# Patient Record
Sex: Male | Born: 1935 | Race: White | Hispanic: No | Marital: Married | Smoking: Former smoker
Health system: Southern US, Community
[De-identification: ages and names within clinical notes are randomized; demographics above are authoritative.]

## PROBLEM LIST (undated history)

## (undated) DIAGNOSIS — K219 Gastro-esophageal reflux disease without esophagitis: Secondary | ICD-10-CM

## (undated) DIAGNOSIS — M8580 Other specified disorders of bone density and structure, unspecified site: Secondary | ICD-10-CM

## (undated) DIAGNOSIS — R7301 Impaired fasting glucose: Secondary | ICD-10-CM

## (undated) DIAGNOSIS — I6529 Occlusion and stenosis of unspecified carotid artery: Secondary | ICD-10-CM

## (undated) DIAGNOSIS — G4733 Obstructive sleep apnea (adult) (pediatric): Secondary | ICD-10-CM

## (undated) DIAGNOSIS — E785 Hyperlipidemia, unspecified: Secondary | ICD-10-CM

## (undated) DIAGNOSIS — G4730 Sleep apnea, unspecified: Secondary | ICD-10-CM

## (undated) DIAGNOSIS — E119 Type 2 diabetes mellitus without complications: Secondary | ICD-10-CM

## (undated) HISTORY — DX: Gastro-esophageal reflux disease without esophagitis: K21.9

## (undated) HISTORY — DX: Type 2 diabetes mellitus without complications: E11.9

## (undated) HISTORY — DX: Obstructive sleep apnea (adult) (pediatric): G47.33

## (undated) HISTORY — DX: Sleep apnea, unspecified: G47.30

## (undated) HISTORY — DX: Occlusion and stenosis of unspecified carotid artery: I65.29

## (undated) HISTORY — DX: Impaired fasting glucose: R73.01

## (undated) HISTORY — DX: Hyperlipidemia, unspecified: E78.5

## (undated) HISTORY — DX: Other specified disorders of bone density and structure, unspecified site: M85.80

## (undated) HISTORY — PX: HERNIA REPAIR: SHX51

---

## 1966-05-30 HISTORY — PX: TYMPANOPLASTY W/ MASTOIDECTOMY: SUR1400

## 1981-05-30 HISTORY — PX: EYE SURGERY: SHX253

## 1999-05-04 ENCOUNTER — Ambulatory Visit (HOSPITAL_COMMUNITY): Admission: RE | Admit: 1999-05-04 | Discharge: 1999-05-04 | Payer: Self-pay | Admitting: Gastroenterology

## 1999-05-04 ENCOUNTER — Encounter (INDEPENDENT_AMBULATORY_CARE_PROVIDER_SITE_OTHER): Payer: Self-pay

## 2003-02-17 ENCOUNTER — Ambulatory Visit (HOSPITAL_COMMUNITY): Admission: RE | Admit: 2003-02-17 | Discharge: 2003-02-17 | Payer: Self-pay | Admitting: Gastroenterology

## 2008-09-10 ENCOUNTER — Ambulatory Visit (HOSPITAL_COMMUNITY): Admission: RE | Admit: 2008-09-10 | Discharge: 2008-09-10 | Payer: Self-pay | Admitting: Surgery

## 2010-02-23 ENCOUNTER — Emergency Department (HOSPITAL_COMMUNITY): Admission: EM | Admit: 2010-02-23 | Discharge: 2010-02-24 | Payer: Self-pay | Admitting: Emergency Medicine

## 2010-09-08 LAB — BASIC METABOLIC PANEL
BUN: 11 mg/dL (ref 6–23)
CO2: 28 mEq/L (ref 19–32)
Calcium: 9.3 mg/dL (ref 8.4–10.5)
Glucose, Bld: 141 mg/dL — ABNORMAL HIGH (ref 70–99)
Potassium: 4.3 mEq/L (ref 3.5–5.1)
Sodium: 141 mEq/L (ref 135–145)

## 2010-09-08 LAB — CBC
HCT: 43 % (ref 39.0–52.0)
Hemoglobin: 14.8 g/dL (ref 13.0–17.0)
MCHC: 34.4 g/dL (ref 30.0–36.0)
Platelets: 144 10*3/uL — ABNORMAL LOW (ref 150–400)
RDW: 13.4 % (ref 11.5–15.5)

## 2010-09-08 LAB — DIFFERENTIAL
Basophils Absolute: 0 10*3/uL (ref 0.0–0.1)
Basophils Relative: 0 % (ref 0–1)
Eosinophils Relative: 4 % (ref 0–5)
Monocytes Absolute: 0.4 10*3/uL (ref 0.1–1.0)

## 2010-10-12 NOTE — Op Note (Signed)
NAME:  Jesse Dillon, Jesse Dillon               ACCOUNT NO.:  [id number]   MEDICAL RECORD NO.:  [id number]          PATIENT TYPE:  AMB   LOCATION:  DAY                          FACILITY:  [facility]   PHYSICIAN:  Wilmon Arms. Corliss Skains, M.D. DATE OF BIRTH:  02-19-1933   DATE OF PROCEDURE:  09/10/2008  DATE OF DISCHARGE:                               OPERATIVE REPORT   PREOPERATIVE DIAGNOSES:  1. Recurrent left inguinal hernia.  2. Direct right inguinal hernia.   PROCEDURE PERFORMED:  Open bilateral inguinal hernia repair with mesh.   SURGEON:  Wilmon Arms. Tsuei, M.D.   ANESTHESIA:  General.   INDICATIONS:  The patient is a 77 year old male who has had a left  inguinal hernia repaired about 35 years ago.  He has developed bulges in  both groins.  He has also had a lot of problems with hemorrhoids.  When  I examined him I recommended repair of the hernias but did not recommend  concurrent hemorrhoidectomy due to the risk of infection.  He presents  now for repairs of his hernias.   DESCRIPTION OF PROCEDURE:  The patient brought to the operating placed  ilioinguinal nerve supine position on operating table.  After an  adequate level of general anesthesia was obtained, the patient's lower  abdomen and pelvis were shaved, prepped with Betadine and draped in  sterile fashion.  Time-out was taken to assure proper patient, proper  procedure.  We infiltrated an area around his previous left groin  incision with the 0.25% Marcaine with epinephrine.  I opened this  incision and dissected down through the subcutaneous tissues and scar  tissue down to the external oblique fascia.  The external oblique fascia  was opened along direction of its fibers down the external ring.  The  patient has a large direct defect with a very diminutive spermatic cord.  We dissected around the spermatic cord and retracted it with a Penrose  drain.  The direct hernia sac was circumferentially dissected was  reduced up into the  preperitoneal space.  The floor of the inguinal  canal was then imbricated with a 0 Vicryl.  Prolene mesh was cut to the  keyhole shape and secured with 2-0 Prolene beginning at the pubic  tubercle running along Cooper's ligament inferiorly and the internal  oblique fascia superiorly.  The tails were sutured together behind the  cord and the tails were tucked underneath the external oblique fascia  laterally.  The fascia was reapproximated 2-0 Vicryl.  3-0 Vicryl was  used to close subcutaneous tissues and 4-0 Monocryl was used to close  the skin.  We turned our attention to the right groin.  We infiltrated  the area above the right inguinal ligament with 0.25% Marcaine with  epinephrine.  An incision was made and dissection was carried down to  the external oblique fascia.  The fascia was opened along direction of  its fibers down to the external ring.  The patient had another direct  hernia on this side.  These spermatic cord was dissected free and was  retracted with a Penrose drain.  There is  no sign of indirect hernia.  After clearing the floor of the inguinal canal, we imbricated with 0  Vicryl in running fashion.  We  cut UltraPro mesh into a keyhole shape and secured with 2-0 Prolene.  The wound was closed in similar fashion.  Steri-Strips and clean  dressings were applied.  The patient was then extubated, brought to the  recovery in stable condition.  All sponge, instrument and needle counts  were correct.      Wilmon Arms. Tsuei, M.D.  Electronically Signed     MKT/MEDQ  D:  09/10/2008  T:  09/10/2008  Job:  [id number]   cc:   Chales Salmon. Abigail Miyamoto, M.D.  Fax: [phone]

## 2010-10-15 NOTE — Op Note (Signed)
   NAME:  Jesse Dillon, Jesse Dillon                         ACCOUNT NO.:  [id number]   MEDICAL RECORD NO.:  [id number]                   PATIENT TYPE:  AMB   LOCATION:  ENDO                                 FACILITY:  [facility]   PHYSICIAN:  James L. Malon Kindle., M.D.          DATE OF BIRTH:  06-Aug-1932   DATE OF PROCEDURE:  02/17/2003  DATE OF DISCHARGE:                                 OPERATIVE REPORT   PROCEDURE:  Colonoscopy and polypectomy.   MEDICATIONS:  Fentanyl 50 mcg, Versed 5 mg IV.   SCOPE:  Adult pediatric adjustable colonoscope.   INDICATIONS:  Patient with a previous history of adenomatous colon polyps.  This is done as a followup   DESCRIPTION OF PROCEDURE:  The procedure had been explained to the patient  and consent obtained.  The patient was placed in the left lateral decubitus  position.  Olympus scope was inserted and advanced.  Prep was excellent.  Was able to reach easily to the cecum. The ileocecal valve and appendiceal  orifice were seen.  The scope was withdrawn into the cecum.  Ascending  colon, hepatic flexure, transverse colon, and descending colon were seen  well.  A 0.5 cm sessile polyp was snared in the descending colon but we were  unable to locate it.  It could, thus, not be recovered.  We did pour all the  suction cannister through without any polyp being recovered.  The patient  had moderate diverticulosis in the sigmoid colon.  No polyps were seen in  the sigmoid.  The scope was withdrawn.  The patient tolerated the procedure  well.   ASSESSMENT:  1. Descending colon polyp removed from snare but unfortunately could not be     found.  2. Diverticulosis.   PLAN:  Routine post polypectomy instructions.  Will recommend repeating  procedure in three years.                                                James L. Malon Kindle., M.D.    Waldron Session  D:  02/17/2003  T:  02/17/2003  Job:  [id number]   cc:   Chales Salmon. Abigail Miyamoto, M.D.  [address]  [place]  [place] [address]  Fax: [phone]

## 2011-09-22 ENCOUNTER — Other Ambulatory Visit: Payer: Self-pay | Admitting: Family Medicine

## 2011-09-22 DIAGNOSIS — E782 Mixed hyperlipidemia: Secondary | ICD-10-CM | POA: Diagnosis not present

## 2011-09-22 DIAGNOSIS — Z0000 Encounter for general adult medical examination without abnormal findings: Secondary | ICD-10-CM | POA: Diagnosis not present

## 2011-09-22 DIAGNOSIS — Z79899 Other long term (current) drug therapy: Secondary | ICD-10-CM | POA: Diagnosis not present

## 2011-09-22 DIAGNOSIS — R7301 Impaired fasting glucose: Secondary | ICD-10-CM | POA: Diagnosis not present

## 2011-09-22 DIAGNOSIS — R9389 Abnormal findings on diagnostic imaging of other specified body structures: Secondary | ICD-10-CM

## 2011-09-22 DIAGNOSIS — M899 Disorder of bone, unspecified: Secondary | ICD-10-CM | POA: Diagnosis not present

## 2011-09-22 DIAGNOSIS — Z125 Encounter for screening for malignant neoplasm of prostate: Secondary | ICD-10-CM | POA: Diagnosis not present

## 2011-09-22 DIAGNOSIS — M79609 Pain in unspecified limb: Secondary | ICD-10-CM | POA: Diagnosis not present

## 2011-09-23 ENCOUNTER — Encounter: Payer: Self-pay | Admitting: Vascular Surgery

## 2011-09-26 ENCOUNTER — Encounter: Payer: Self-pay | Admitting: Vascular Surgery

## 2011-09-27 ENCOUNTER — Ambulatory Visit
Admission: RE | Admit: 2011-09-27 | Discharge: 2011-09-27 | Disposition: A | Discharge status: Home / Self Care | Payer: Medicare Other | Source: Ambulatory Visit | Attending: Family Medicine | Admitting: Family Medicine

## 2011-09-27 ENCOUNTER — Encounter: Payer: Self-pay | Admitting: Vascular Surgery

## 2011-09-27 ENCOUNTER — Ambulatory Visit (INDEPENDENT_AMBULATORY_CARE_PROVIDER_SITE_OTHER): Payer: Medicare Other | Admitting: *Deleted

## 2011-09-27 ENCOUNTER — Ambulatory Visit (INDEPENDENT_AMBULATORY_CARE_PROVIDER_SITE_OTHER): Payer: Medicare Other | Admitting: Vascular Surgery

## 2011-09-27 VITALS — BP 125/64 | HR 55 | Temp 97.5°F | Ht 68.0 in | Wt 177.0 lb

## 2011-09-27 DIAGNOSIS — R9389 Abnormal findings on diagnostic imaging of other specified body structures: Secondary | ICD-10-CM

## 2011-09-27 DIAGNOSIS — I6529 Occlusion and stenosis of unspecified carotid artery: Secondary | ICD-10-CM | POA: Diagnosis not present

## 2011-09-27 DIAGNOSIS — R9430 Abnormal result of cardiovascular function study, unspecified: Secondary | ICD-10-CM

## 2011-09-27 DIAGNOSIS — I70219 Atherosclerosis of native arteries of extremities with intermittent claudication, unspecified extremity: Secondary | ICD-10-CM | POA: Insufficient documentation

## 2011-09-27 DIAGNOSIS — M79609 Pain in unspecified limb: Secondary | ICD-10-CM | POA: Diagnosis not present

## 2011-09-27 NOTE — Progress Notes (Signed)
Subjective:     Patient ID: Jesse Dillon, male   DOB: November 14, 1932, 78 y.o.   MRN: [id number]  HPI this 78 year old male patient was referred by Dr. Catha Gosselin for evaluation of lower extremity circulation. Patient states he has occasional hot sensations on lateral aspect of right lower leg. This lasted 10 seconds or so. Not. Accompanied by edema or other symptoms.. He denies claudication symptoms being able to ambulate several blocks. He has no history of gangrene, cellulitis, nonhealing ulcers, or infection. He also has no history of DVT, thrombophlebitis, pulmonary embolus, or clotting problems. He had a borderline abnormal life screen lower extremity study. He also had some mild left carotid disease on [employer] screening. He has no history of stroke or lateralizing weakness, amaurosis fugax, diplopia, blurred vision, or syncope.  Past Medical History  Diagnosis Date  . Hyperlipidemia   . Obstructive sleep apnea   . Osteopenia   . Impaired fasting glucose   . Carotid artery occlusion   . GERD (gastroesophageal reflux disease)     History  Substance Use Topics  . Smoking status: Former Smoker    Quit date: 05/30/1972  . Smokeless tobacco: Not on file  . Alcohol Use: 0.6 oz/week    1 Glasses of wine per week     occasional    Family History  Problem Relation Age of Onset  . Stroke Mother     x 2  . Diabetes Mother   . Heart disease Mother   . Hypertension Mother   . Heart disease Father   . Heart attack Father   . Migraines Sister     Allergies  Allergen Reactions  . Lipitor (Atorvastatin Calcium)   . Zetia (Ezetimibe)   . Ceftin Rash  . Niaspan (Niacin Er (Antihyperlipidemic)) Rash  . Sulfa Antibiotics Rash    Current outpatient prescriptions:Ascorbic Acid (VITAMIN C) 1000 MG tablet, Take 1,000 mg by mouth daily., Disp: , Rfl: ;  aspirin 81 MG tablet, Take 81 mg by mouth daily., Disp: , Rfl: ;  ASTEPRO 0.15 % SOLN, 2 puffs in each nostril daily, Disp: , Rfl: ;   azelastine (ASTELIN) 137 MCG/SPRAY nasal spray, Place 1 spray into the nose 2 (two) times daily. Use in each nostril as directed, Disp: , Rfl:  cholecalciferol (VITAMIN D) 1000 UNITS tablet, Take 1,000 Units by mouth daily., Disp: , Rfl: ;  co-enzyme Q-10 30 MG capsule, Take 200 mg by mouth daily., Disp: , Rfl: ;  doxycycline (DORYX) 100 MG EC tablet, Take 100 mg by mouth 2 (two) times daily., Disp: , Rfl: ;  fish oil-omega-3 fatty acids 1000 MG capsule, Take 1 g by mouth daily., Disp: , Rfl: ;  fluocinonide cream (LIDEX) 0.05 %, Apply 1 application topically as needed., Disp: , Rfl:  folic acid (FOLVITE) 800 MCG tablet, Take 800 mcg by mouth daily., Disp: , Rfl: ;  Glucosamine-Chondroit-Vit C-Mn (GLUCOSAMINE 1500 COMPLEX PO), Take by mouth., Disp: , Rfl: ;  polycarbophil (FIBERCON) 625 MG tablet, Take 625 mg by mouth daily., Disp: , Rfl: ;  rosuvastatin (CRESTOR) 5 MG tablet, Take 5 mg by mouth daily., Disp: , Rfl: ;  alendronate (FOSAMAX) 70 MG tablet, Take 70 mg by mouth daily. Will begin on Sat 10/01/2011, Disp: , Rfl:   BP 125/64  Pulse 55  Temp(Src) 97.5 F (36.4 C) (Oral)  Ht 5\' 8"  (1.727 m)  Wt 177 lb (80.287 kg)  BMI 26.91 kg/m2  SpO2 100%  Body mass index is 26.91 kg/(m^2).  Review of Systems denies chest pain, dyspnea on exertion, PND, orthopnea, hemoptysis. All other systems negative and complete review of systems     Objective:   Physical Exam pressure 125/64 heart rate 55 respirations 16 Gen.-alert and oriented x3 in no apparent distress HEENT normal for age Lungs no rhonchi or wheezing Cardiovascular regular rhythm no murmurs carotid pulses 3+ palpable no bruits audible Abdomen soft nontender no palpable masses Musculoskeletal free of  major deformities Skin clear -no rashes Neurologic normal Lower extremities 3+ femoral and dorsalis pedis pulses palpable bilaterally with no edema. There is no evidence of venous insufficiency in either lower extremity.  No varicosities are noted. The area where the burning discomfort occurs on the lateral aspect of the right lower leg has no abnormal findings on physical exam  Today I ordered lower extremity arterial Doppler study which I reviewed and interpreted. ABIs are totally normal.      Assessment:     No evidence of arterial or venous insufficiency to explain occasional warm sensation right lower leg    Plan:     No further workup indicated for arterial or venous disease and lower extremities Mild carotid occlusive disease will be followed by Dr. Clarene Duke with periodic duplex scan of carotid arteries.-Patient is asymptomatic

## 2011-11-09 DIAGNOSIS — R972 Elevated prostate specific antigen [PSA]: Secondary | ICD-10-CM | POA: Diagnosis not present

## 2011-11-10 DIAGNOSIS — H2510 Age-related nuclear cataract, unspecified eye: Secondary | ICD-10-CM | POA: Diagnosis not present

## 2011-12-07 DIAGNOSIS — R21 Rash and other nonspecific skin eruption: Secondary | ICD-10-CM | POA: Diagnosis not present

## 2011-12-07 DIAGNOSIS — T148XXA Other injury of unspecified body region, initial encounter: Secondary | ICD-10-CM | POA: Diagnosis not present

## 2012-02-08 DIAGNOSIS — Z23 Encounter for immunization: Secondary | ICD-10-CM | POA: Diagnosis not present

## 2012-02-21 DIAGNOSIS — G4733 Obstructive sleep apnea (adult) (pediatric): Secondary | ICD-10-CM | POA: Diagnosis not present

## 2012-03-14 DIAGNOSIS — Z23 Encounter for immunization: Secondary | ICD-10-CM | POA: Diagnosis not present

## 2012-04-06 DIAGNOSIS — R7301 Impaired fasting glucose: Secondary | ICD-10-CM | POA: Diagnosis not present

## 2012-04-06 DIAGNOSIS — R972 Elevated prostate specific antigen [PSA]: Secondary | ICD-10-CM | POA: Diagnosis not present

## 2012-04-30 DIAGNOSIS — J069 Acute upper respiratory infection, unspecified: Secondary | ICD-10-CM | POA: Diagnosis not present

## 2012-05-07 DIAGNOSIS — J40 Bronchitis, not specified as acute or chronic: Secondary | ICD-10-CM | POA: Diagnosis not present

## 2012-05-07 DIAGNOSIS — J329 Chronic sinusitis, unspecified: Secondary | ICD-10-CM | POA: Diagnosis not present

## 2012-06-21 DIAGNOSIS — M25519 Pain in unspecified shoulder: Secondary | ICD-10-CM | POA: Diagnosis not present

## 2012-06-29 DIAGNOSIS — L819 Disorder of pigmentation, unspecified: Secondary | ICD-10-CM | POA: Diagnosis not present

## 2012-06-29 DIAGNOSIS — D1801 Hemangioma of skin and subcutaneous tissue: Secondary | ICD-10-CM | POA: Diagnosis not present

## 2012-06-29 DIAGNOSIS — D2360 Other benign neoplasm of skin of unspecified upper limb, including shoulder: Secondary | ICD-10-CM | POA: Diagnosis not present

## 2012-06-29 DIAGNOSIS — D485 Neoplasm of uncertain behavior of skin: Secondary | ICD-10-CM | POA: Diagnosis not present

## 2012-06-29 DIAGNOSIS — L820 Inflamed seborrheic keratosis: Secondary | ICD-10-CM | POA: Diagnosis not present

## 2012-06-29 DIAGNOSIS — L821 Other seborrheic keratosis: Secondary | ICD-10-CM | POA: Diagnosis not present

## 2012-07-10 DIAGNOSIS — R972 Elevated prostate specific antigen [PSA]: Secondary | ICD-10-CM | POA: Diagnosis not present

## 2012-07-18 DIAGNOSIS — R972 Elevated prostate specific antigen [PSA]: Secondary | ICD-10-CM | POA: Diagnosis not present

## 2012-10-02 DIAGNOSIS — Z79899 Other long term (current) drug therapy: Secondary | ICD-10-CM | POA: Diagnosis not present

## 2012-10-02 DIAGNOSIS — K219 Gastro-esophageal reflux disease without esophagitis: Secondary | ICD-10-CM | POA: Diagnosis not present

## 2012-10-02 DIAGNOSIS — M949 Disorder of cartilage, unspecified: Secondary | ICD-10-CM | POA: Diagnosis not present

## 2012-10-02 DIAGNOSIS — R7301 Impaired fasting glucose: Secondary | ICD-10-CM | POA: Diagnosis not present

## 2012-10-02 DIAGNOSIS — Z0000 Encounter for general adult medical examination without abnormal findings: Secondary | ICD-10-CM | POA: Diagnosis not present

## 2012-10-02 DIAGNOSIS — E7800 Pure hypercholesterolemia, unspecified: Secondary | ICD-10-CM | POA: Diagnosis not present

## 2012-10-02 DIAGNOSIS — J309 Allergic rhinitis, unspecified: Secondary | ICD-10-CM | POA: Diagnosis not present

## 2012-10-02 DIAGNOSIS — M899 Disorder of bone, unspecified: Secondary | ICD-10-CM | POA: Diagnosis not present

## 2012-10-02 DIAGNOSIS — R972 Elevated prostate specific antigen [PSA]: Secondary | ICD-10-CM | POA: Diagnosis not present

## 2012-12-10 DIAGNOSIS — H2510 Age-related nuclear cataract, unspecified eye: Secondary | ICD-10-CM | POA: Diagnosis not present

## 2013-01-07 DIAGNOSIS — R972 Elevated prostate specific antigen [PSA]: Secondary | ICD-10-CM | POA: Diagnosis not present

## 2013-01-11 DIAGNOSIS — R972 Elevated prostate specific antigen [PSA]: Secondary | ICD-10-CM | POA: Diagnosis not present

## 2013-02-05 DIAGNOSIS — Z23 Encounter for immunization: Secondary | ICD-10-CM | POA: Diagnosis not present

## 2013-02-21 DIAGNOSIS — G4733 Obstructive sleep apnea (adult) (pediatric): Secondary | ICD-10-CM | POA: Diagnosis not present

## 2013-04-02 DIAGNOSIS — R7309 Other abnormal glucose: Secondary | ICD-10-CM | POA: Diagnosis not present

## 2013-07-01 DIAGNOSIS — L820 Inflamed seborrheic keratosis: Secondary | ICD-10-CM | POA: Diagnosis not present

## 2013-07-01 DIAGNOSIS — D1801 Hemangioma of skin and subcutaneous tissue: Secondary | ICD-10-CM | POA: Diagnosis not present

## 2013-07-01 DIAGNOSIS — L819 Disorder of pigmentation, unspecified: Secondary | ICD-10-CM | POA: Diagnosis not present

## 2013-07-01 DIAGNOSIS — L821 Other seborrheic keratosis: Secondary | ICD-10-CM | POA: Diagnosis not present

## 2013-07-01 DIAGNOSIS — D239 Other benign neoplasm of skin, unspecified: Secondary | ICD-10-CM | POA: Diagnosis not present

## 2013-09-09 DIAGNOSIS — R220 Localized swelling, mass and lump, head: Secondary | ICD-10-CM | POA: Diagnosis not present

## 2013-09-09 DIAGNOSIS — J329 Chronic sinusitis, unspecified: Secondary | ICD-10-CM | POA: Diagnosis not present

## 2013-09-09 DIAGNOSIS — G4733 Obstructive sleep apnea (adult) (pediatric): Secondary | ICD-10-CM | POA: Diagnosis not present

## 2013-09-09 DIAGNOSIS — R490 Dysphonia: Secondary | ICD-10-CM | POA: Diagnosis not present

## 2013-09-09 DIAGNOSIS — R221 Localized swelling, mass and lump, neck: Secondary | ICD-10-CM | POA: Diagnosis not present

## 2013-09-28 DIAGNOSIS — R1032 Left lower quadrant pain: Secondary | ICD-10-CM | POA: Diagnosis not present

## 2013-10-10 DIAGNOSIS — Z23 Encounter for immunization: Secondary | ICD-10-CM | POA: Diagnosis not present

## 2013-10-10 DIAGNOSIS — M899 Disorder of bone, unspecified: Secondary | ICD-10-CM | POA: Diagnosis not present

## 2013-10-10 DIAGNOSIS — Z0000 Encounter for general adult medical examination without abnormal findings: Secondary | ICD-10-CM | POA: Diagnosis not present

## 2013-10-10 DIAGNOSIS — M949 Disorder of cartilage, unspecified: Secondary | ICD-10-CM | POA: Diagnosis not present

## 2013-10-10 DIAGNOSIS — E7800 Pure hypercholesterolemia, unspecified: Secondary | ICD-10-CM | POA: Diagnosis not present

## 2013-10-10 DIAGNOSIS — R7301 Impaired fasting glucose: Secondary | ICD-10-CM | POA: Diagnosis not present

## 2013-10-10 DIAGNOSIS — Z79899 Other long term (current) drug therapy: Secondary | ICD-10-CM | POA: Diagnosis not present

## 2013-10-10 DIAGNOSIS — J309 Allergic rhinitis, unspecified: Secondary | ICD-10-CM | POA: Diagnosis not present

## 2013-10-10 DIAGNOSIS — G4733 Obstructive sleep apnea (adult) (pediatric): Secondary | ICD-10-CM | POA: Diagnosis not present

## 2013-10-14 DIAGNOSIS — M899 Disorder of bone, unspecified: Secondary | ICD-10-CM | POA: Diagnosis not present

## 2013-10-14 DIAGNOSIS — M949 Disorder of cartilage, unspecified: Secondary | ICD-10-CM | POA: Diagnosis not present

## 2013-12-26 DIAGNOSIS — H2510 Age-related nuclear cataract, unspecified eye: Secondary | ICD-10-CM | POA: Diagnosis not present

## 2014-01-08 DIAGNOSIS — R972 Elevated prostate specific antigen [PSA]: Secondary | ICD-10-CM | POA: Diagnosis not present

## 2014-01-15 DIAGNOSIS — R972 Elevated prostate specific antigen [PSA]: Secondary | ICD-10-CM | POA: Diagnosis not present

## 2014-02-12 DIAGNOSIS — Z23 Encounter for immunization: Secondary | ICD-10-CM | POA: Diagnosis not present

## 2014-02-25 DIAGNOSIS — G4733 Obstructive sleep apnea (adult) (pediatric): Secondary | ICD-10-CM | POA: Diagnosis not present

## 2014-03-13 IMAGING — US US CAROTID DUPLEX BILAT
1 series · 13 of 24 positions shown · non-contrast
Comparison: None.

CLINICAL DATA: Prior smoker, elevated cholesterol, abnormal
[employer] screening exam

BILATERAL CAROTID DUPLEX ULTRASOUND
TECHNIQUE: Gray scale imaging, color Doppler and duplex ultrasound
was performed of bilateral carotid and vertebral arteries in the
neck.

[Series 1: us carotid duplex bilat · 0.07mm/px · 13 of 60 slices shown]
[im 1/60]
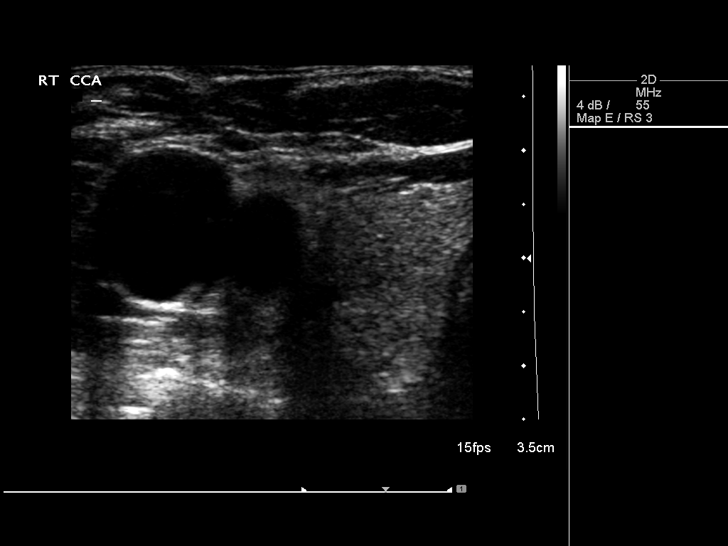
[im 6/60]
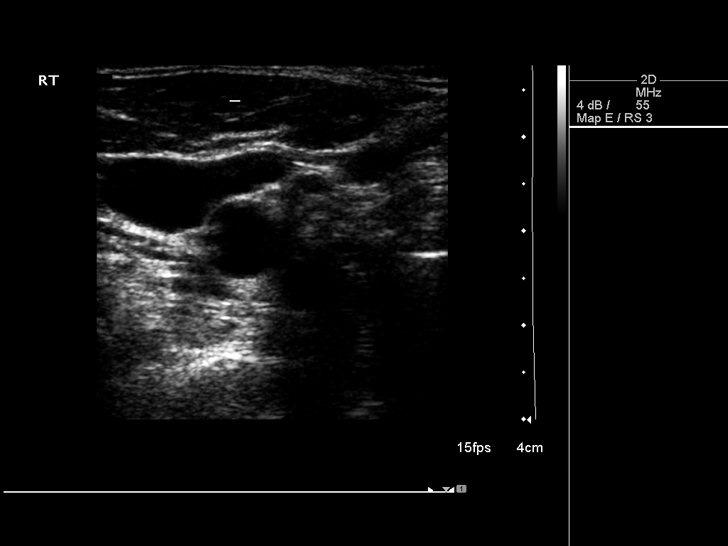
[im 11/60]
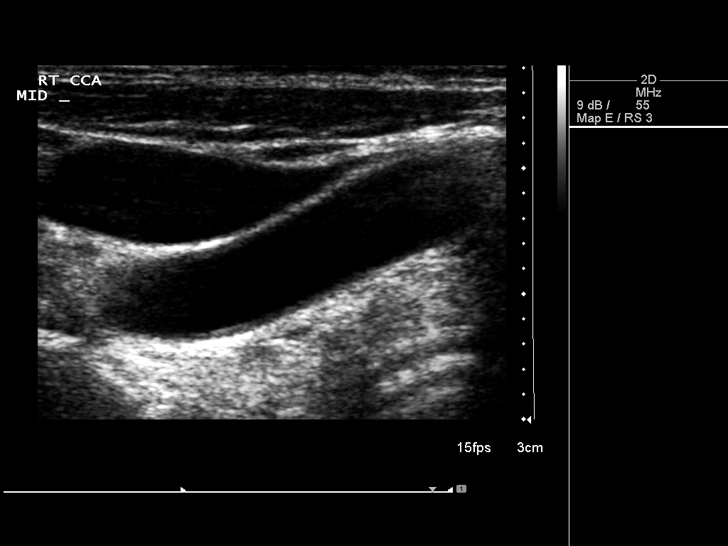
[im 16/60]
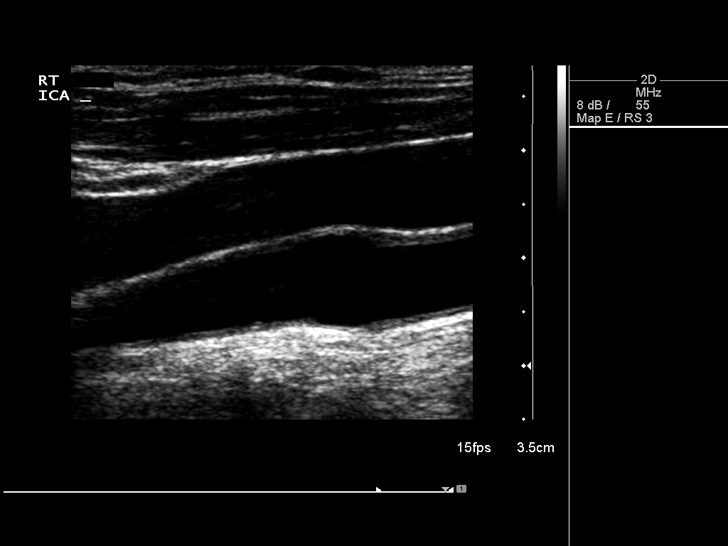
[im 21/60]
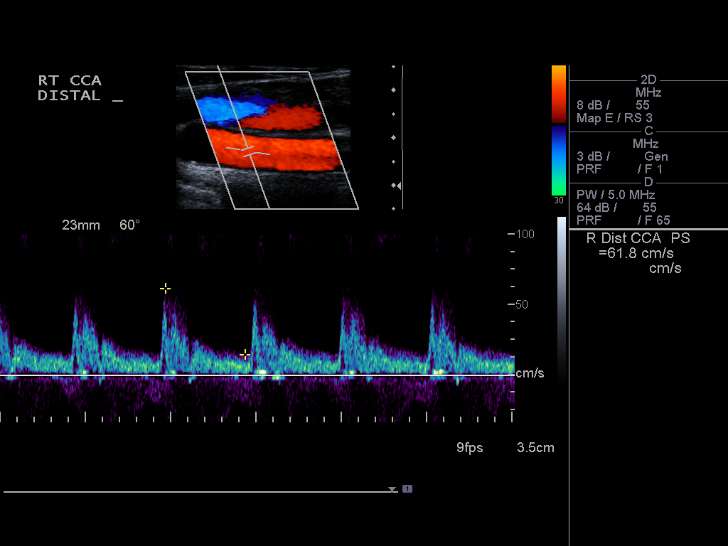
[im 26/60]
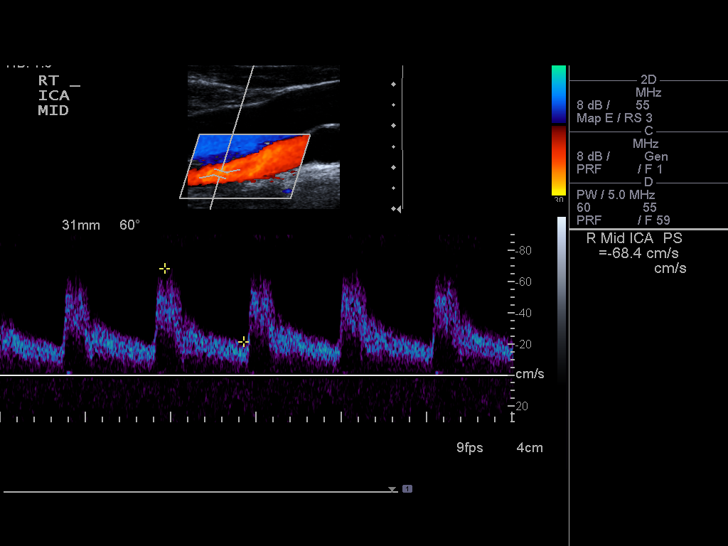
[im 31/60]
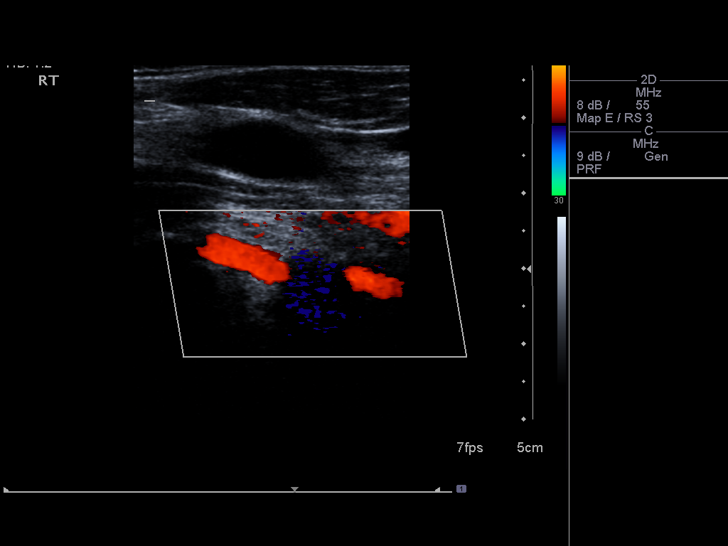
[im 34/60]
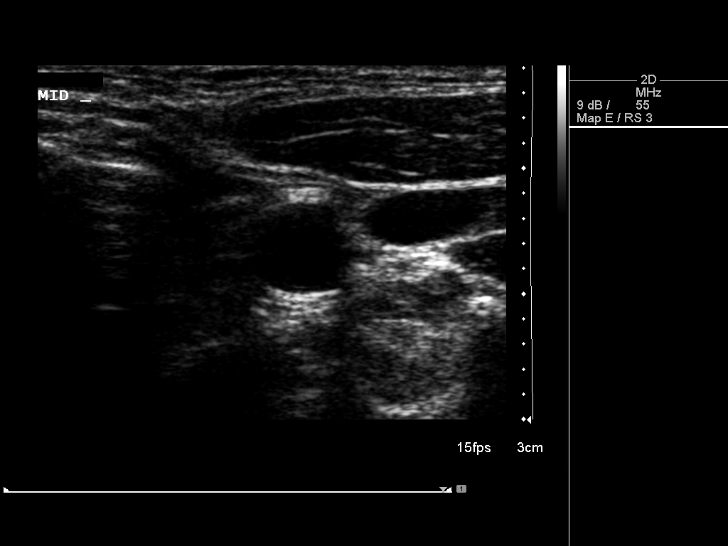
[im 39/60]
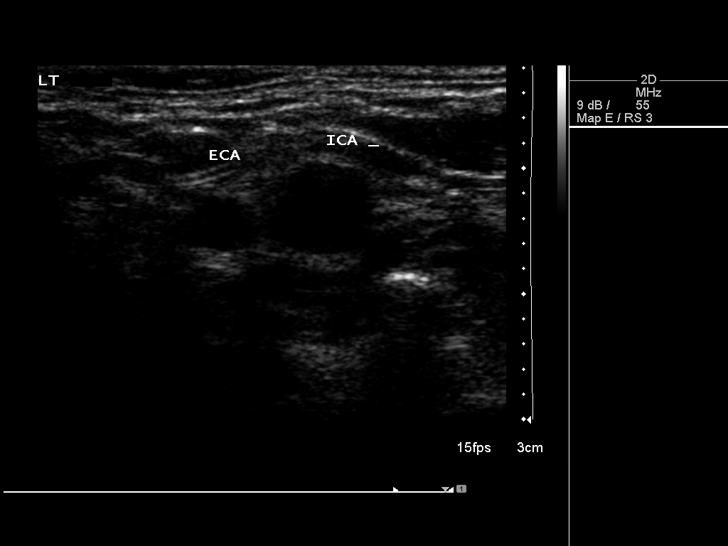
[im 44/60]
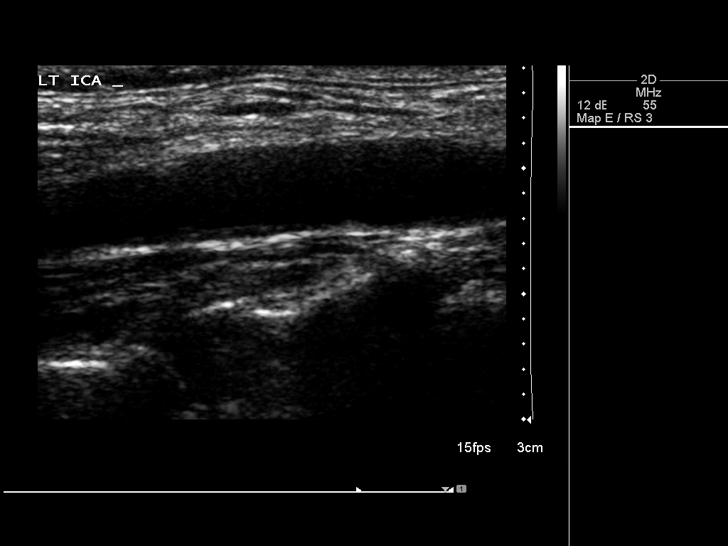
[im 49/60]
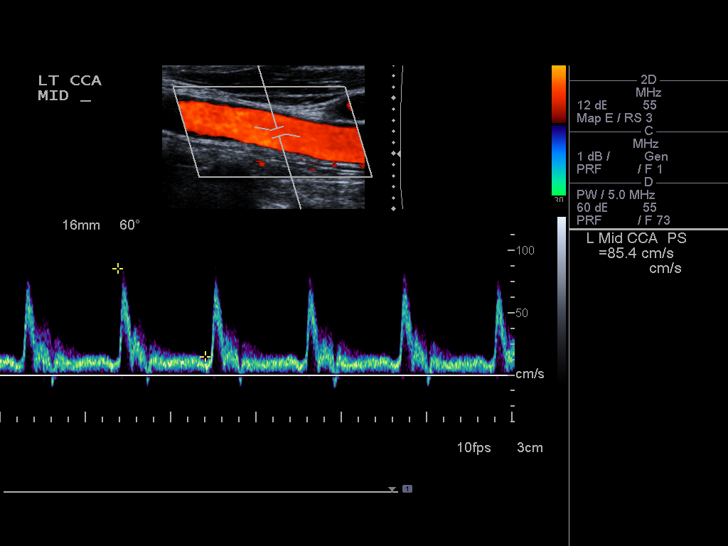
[im 54/60]
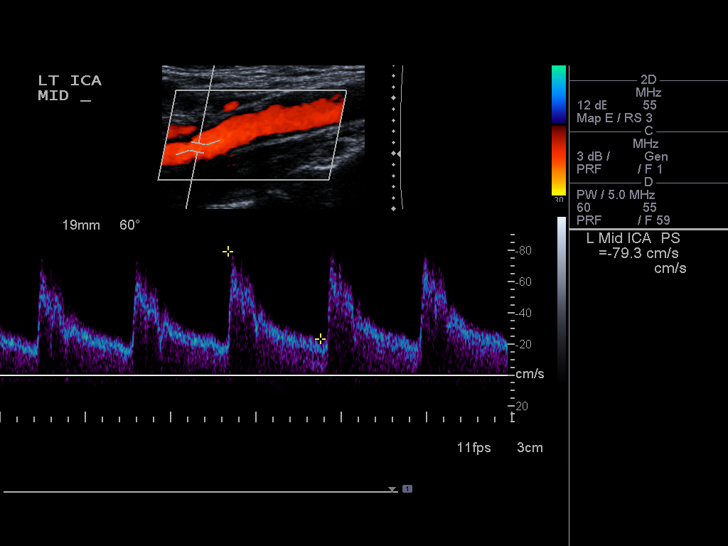
[im 60/60]
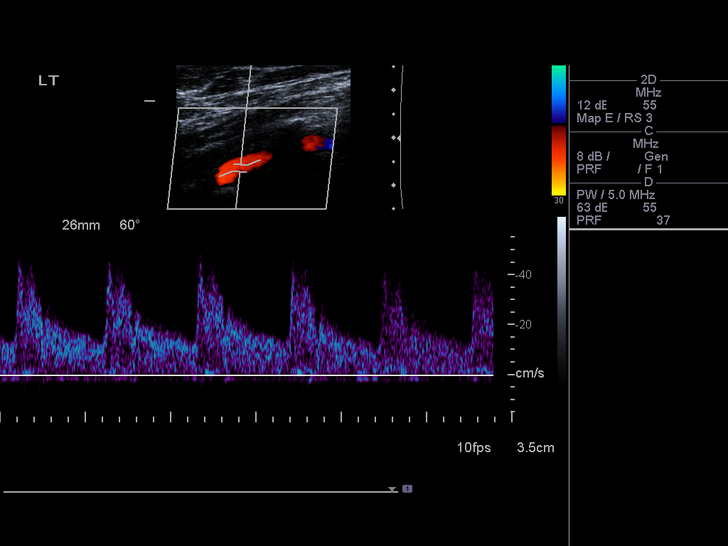

[13 of 24 positions shown; findings below may reference images not displayed]

Criteria:  Quantification of carotid stenosis is based on velocity
parameters that correlate the residual internal carotid diameter
with NASCET-based stenosis levels, using the diameter of the distal
internal carotid lumen as the denominator for stenosis measurement.

The following velocity measurements were obtained:

                 PEAK SYSTOLIC/END DIASTOLIC
RIGHT
ICA:                        68/21cm/sec
CCA:                        92/20cm/sec
SYSTOLIC ICA/CCA RATIO:
DIASTOLIC ICA/CCA RATIO:
ECA:                        86cm/sec

LEFT
ICA:                        83/29cm/sec
CCA:                        85/15cm/sec
SYSTOLIC ICA/CCA RATIO:
DIASTOLIC ICA/CCA RATIO:
ECA:                        82cm/sec
FINDINGS: RIGHT CAROTID ARTERY: Very minor intimal thickening.  No
significant plaque formation.  No hemodynamically significant right
ICA stenosis, velocity elevation, or turbulent flow.

RIGHT VERTEBRAL ARTERY:  Antegrade

LEFT CAROTID ARTERY: Very minor intimal thickening.  No significant
atherosclerosis.  No hemodynamically significant left ICA stenosis,
velocity elevation, or turbulent flow.

LEFT VERTEBRAL ARTERY:  Antegrade
IMPRESSION: No hemodynamically significant carotid stenosis by ultrasound.
Minor intimal thickening bilaterally.

## 2014-06-30 DIAGNOSIS — L821 Other seborrheic keratosis: Secondary | ICD-10-CM | POA: Diagnosis not present

## 2014-06-30 DIAGNOSIS — L570 Actinic keratosis: Secondary | ICD-10-CM | POA: Diagnosis not present

## 2014-06-30 DIAGNOSIS — D485 Neoplasm of uncertain behavior of skin: Secondary | ICD-10-CM | POA: Diagnosis not present

## 2014-06-30 DIAGNOSIS — L859 Epidermal thickening, unspecified: Secondary | ICD-10-CM | POA: Diagnosis not present

## 2014-06-30 DIAGNOSIS — L308 Other specified dermatitis: Secondary | ICD-10-CM | POA: Diagnosis not present

## 2014-06-30 DIAGNOSIS — D1801 Hemangioma of skin and subcutaneous tissue: Secondary | ICD-10-CM | POA: Diagnosis not present

## 2014-06-30 DIAGNOSIS — L812 Freckles: Secondary | ICD-10-CM | POA: Diagnosis not present

## 2014-10-24 DIAGNOSIS — Z0000 Encounter for general adult medical examination without abnormal findings: Secondary | ICD-10-CM | POA: Diagnosis not present

## 2014-10-24 DIAGNOSIS — E782 Mixed hyperlipidemia: Secondary | ICD-10-CM | POA: Diagnosis not present

## 2014-10-24 DIAGNOSIS — Z86010 Personal history of colonic polyps: Secondary | ICD-10-CM | POA: Diagnosis not present

## 2014-10-24 DIAGNOSIS — M8580 Other specified disorders of bone density and structure, unspecified site: Secondary | ICD-10-CM | POA: Diagnosis not present

## 2014-10-24 DIAGNOSIS — G4733 Obstructive sleep apnea (adult) (pediatric): Secondary | ICD-10-CM | POA: Diagnosis not present

## 2014-10-24 DIAGNOSIS — M859 Disorder of bone density and structure, unspecified: Secondary | ICD-10-CM | POA: Diagnosis not present

## 2014-10-24 DIAGNOSIS — D696 Thrombocytopenia, unspecified: Secondary | ICD-10-CM | POA: Diagnosis not present

## 2014-10-24 DIAGNOSIS — Z79899 Other long term (current) drug therapy: Secondary | ICD-10-CM | POA: Diagnosis not present

## 2014-10-24 DIAGNOSIS — R972 Elevated prostate specific antigen [PSA]: Secondary | ICD-10-CM | POA: Diagnosis not present

## 2014-10-24 DIAGNOSIS — R7301 Impaired fasting glucose: Secondary | ICD-10-CM | POA: Diagnosis not present

## 2014-12-29 DIAGNOSIS — H2513 Age-related nuclear cataract, bilateral: Secondary | ICD-10-CM | POA: Diagnosis not present

## 2015-01-08 DIAGNOSIS — R972 Elevated prostate specific antigen [PSA]: Secondary | ICD-10-CM | POA: Diagnosis not present

## 2015-01-16 DIAGNOSIS — R972 Elevated prostate specific antigen [PSA]: Secondary | ICD-10-CM | POA: Diagnosis not present

## 2015-02-12 DIAGNOSIS — Z23 Encounter for immunization: Secondary | ICD-10-CM | POA: Diagnosis not present

## 2015-02-26 DIAGNOSIS — G4733 Obstructive sleep apnea (adult) (pediatric): Secondary | ICD-10-CM | POA: Diagnosis not present

## 2015-03-13 DIAGNOSIS — H18411 Arcus senilis, right eye: Secondary | ICD-10-CM | POA: Diagnosis not present

## 2015-03-13 DIAGNOSIS — H02839 Dermatochalasis of unspecified eye, unspecified eyelid: Secondary | ICD-10-CM | POA: Diagnosis not present

## 2015-03-13 DIAGNOSIS — H2512 Age-related nuclear cataract, left eye: Secondary | ICD-10-CM | POA: Diagnosis not present

## 2015-03-13 DIAGNOSIS — H2511 Age-related nuclear cataract, right eye: Secondary | ICD-10-CM | POA: Diagnosis not present

## 2015-03-13 DIAGNOSIS — H18412 Arcus senilis, left eye: Secondary | ICD-10-CM | POA: Diagnosis not present

## 2015-03-30 DIAGNOSIS — H2512 Age-related nuclear cataract, left eye: Secondary | ICD-10-CM | POA: Diagnosis not present

## 2015-03-30 DIAGNOSIS — H25812 Combined forms of age-related cataract, left eye: Secondary | ICD-10-CM | POA: Diagnosis not present

## 2015-03-31 DIAGNOSIS — H2511 Age-related nuclear cataract, right eye: Secondary | ICD-10-CM | POA: Diagnosis not present

## 2015-03-31 DIAGNOSIS — H25011 Cortical age-related cataract, right eye: Secondary | ICD-10-CM | POA: Diagnosis not present

## 2015-03-31 DIAGNOSIS — H25041 Posterior subcapsular polar age-related cataract, right eye: Secondary | ICD-10-CM | POA: Diagnosis not present

## 2015-04-27 DIAGNOSIS — H2511 Age-related nuclear cataract, right eye: Secondary | ICD-10-CM | POA: Diagnosis not present

## 2015-04-27 DIAGNOSIS — H25811 Combined forms of age-related cataract, right eye: Secondary | ICD-10-CM | POA: Diagnosis not present

## 2015-04-27 DIAGNOSIS — H25011 Cortical age-related cataract, right eye: Secondary | ICD-10-CM | POA: Diagnosis not present

## 2015-04-28 DIAGNOSIS — R972 Elevated prostate specific antigen [PSA]: Secondary | ICD-10-CM | POA: Diagnosis not present

## 2015-06-18 DIAGNOSIS — M549 Dorsalgia, unspecified: Secondary | ICD-10-CM | POA: Diagnosis not present

## 2015-06-18 DIAGNOSIS — K5792 Diverticulitis of intestine, part unspecified, without perforation or abscess without bleeding: Secondary | ICD-10-CM | POA: Diagnosis not present

## 2015-06-18 DIAGNOSIS — R1032 Left lower quadrant pain: Secondary | ICD-10-CM | POA: Diagnosis not present

## 2015-06-22 DIAGNOSIS — K5792 Diverticulitis of intestine, part unspecified, without perforation or abscess without bleeding: Secondary | ICD-10-CM | POA: Diagnosis not present

## 2015-07-06 DIAGNOSIS — D485 Neoplasm of uncertain behavior of skin: Secondary | ICD-10-CM | POA: Diagnosis not present

## 2015-07-06 DIAGNOSIS — L821 Other seborrheic keratosis: Secondary | ICD-10-CM | POA: Diagnosis not present

## 2015-07-06 DIAGNOSIS — D1801 Hemangioma of skin and subcutaneous tissue: Secondary | ICD-10-CM | POA: Diagnosis not present

## 2015-07-06 DIAGNOSIS — L812 Freckles: Secondary | ICD-10-CM | POA: Diagnosis not present

## 2015-07-22 DIAGNOSIS — R972 Elevated prostate specific antigen [PSA]: Secondary | ICD-10-CM | POA: Diagnosis not present

## 2015-07-29 DIAGNOSIS — R972 Elevated prostate specific antigen [PSA]: Secondary | ICD-10-CM | POA: Diagnosis not present

## 2015-07-29 DIAGNOSIS — Z0000 Encounter for general adult medical examination without abnormal findings: Secondary | ICD-10-CM | POA: Diagnosis not present

## 2015-11-12 DIAGNOSIS — R7301 Impaired fasting glucose: Secondary | ICD-10-CM | POA: Diagnosis not present

## 2015-11-12 DIAGNOSIS — E782 Mixed hyperlipidemia: Secondary | ICD-10-CM | POA: Diagnosis not present

## 2015-11-12 DIAGNOSIS — Z0000 Encounter for general adult medical examination without abnormal findings: Secondary | ICD-10-CM | POA: Diagnosis not present

## 2015-11-12 DIAGNOSIS — R972 Elevated prostate specific antigen [PSA]: Secondary | ICD-10-CM | POA: Diagnosis not present

## 2015-11-12 DIAGNOSIS — M8580 Other specified disorders of bone density and structure, unspecified site: Secondary | ICD-10-CM | POA: Diagnosis not present

## 2015-11-12 DIAGNOSIS — H6120 Impacted cerumen, unspecified ear: Secondary | ICD-10-CM | POA: Diagnosis not present

## 2015-11-12 DIAGNOSIS — Z79899 Other long term (current) drug therapy: Secondary | ICD-10-CM | POA: Diagnosis not present

## 2015-11-12 DIAGNOSIS — K219 Gastro-esophageal reflux disease without esophagitis: Secondary | ICD-10-CM | POA: Diagnosis not present

## 2015-11-12 DIAGNOSIS — D696 Thrombocytopenia, unspecified: Secondary | ICD-10-CM | POA: Diagnosis not present

## 2015-11-12 DIAGNOSIS — Z86010 Personal history of colonic polyps: Secondary | ICD-10-CM | POA: Diagnosis not present

## 2015-11-12 DIAGNOSIS — G4733 Obstructive sleep apnea (adult) (pediatric): Secondary | ICD-10-CM | POA: Diagnosis not present

## 2015-11-12 DIAGNOSIS — M899 Disorder of bone, unspecified: Secondary | ICD-10-CM | POA: Diagnosis not present

## 2015-12-08 DIAGNOSIS — H18411 Arcus senilis, right eye: Secondary | ICD-10-CM | POA: Diagnosis not present

## 2015-12-08 DIAGNOSIS — Z961 Presence of intraocular lens: Secondary | ICD-10-CM | POA: Diagnosis not present

## 2015-12-08 DIAGNOSIS — M8588 Other specified disorders of bone density and structure, other site: Secondary | ICD-10-CM | POA: Diagnosis not present

## 2015-12-08 DIAGNOSIS — H02839 Dermatochalasis of unspecified eye, unspecified eyelid: Secondary | ICD-10-CM | POA: Diagnosis not present

## 2015-12-08 DIAGNOSIS — H18412 Arcus senilis, left eye: Secondary | ICD-10-CM | POA: Diagnosis not present

## 2016-01-11 DIAGNOSIS — J069 Acute upper respiratory infection, unspecified: Secondary | ICD-10-CM | POA: Diagnosis not present

## 2016-02-08 DIAGNOSIS — H11431 Conjunctival hyperemia, right eye: Secondary | ICD-10-CM | POA: Diagnosis not present

## 2016-02-18 DIAGNOSIS — Z23 Encounter for immunization: Secondary | ICD-10-CM | POA: Diagnosis not present

## 2016-03-01 DIAGNOSIS — G4733 Obstructive sleep apnea (adult) (pediatric): Secondary | ICD-10-CM | POA: Diagnosis not present

## 2016-03-31 DIAGNOSIS — H1131 Conjunctival hemorrhage, right eye: Secondary | ICD-10-CM | POA: Diagnosis not present

## 2016-04-05 DIAGNOSIS — H1131 Conjunctival hemorrhage, right eye: Secondary | ICD-10-CM | POA: Diagnosis not present

## 2016-05-05 DIAGNOSIS — K640 First degree hemorrhoids: Secondary | ICD-10-CM | POA: Diagnosis not present

## 2016-05-05 DIAGNOSIS — Z86010 Personal history of colonic polyps: Secondary | ICD-10-CM | POA: Diagnosis not present

## 2016-05-05 DIAGNOSIS — K5730 Diverticulosis of large intestine without perforation or abscess without bleeding: Secondary | ICD-10-CM | POA: Diagnosis not present

## 2016-05-20 DIAGNOSIS — J209 Acute bronchitis, unspecified: Secondary | ICD-10-CM | POA: Diagnosis not present

## 2017-01-18 ENCOUNTER — Other Ambulatory Visit: Payer: Self-pay | Admitting: Family Medicine

## 2017-01-18 DIAGNOSIS — I779 Disorder of arteries and arterioles, unspecified: Secondary | ICD-10-CM

## 2017-01-18 DIAGNOSIS — I739 Peripheral vascular disease, unspecified: Principal | ICD-10-CM

## 2017-01-20 ENCOUNTER — Ambulatory Visit
Admission: RE | Admit: 2017-01-20 | Discharge: 2017-01-20 | Disposition: A | Discharge status: Home / Self Care | Payer: Medicare Other | Source: Ambulatory Visit | Attending: Family Medicine | Admitting: Family Medicine

## 2017-01-20 DIAGNOSIS — I739 Peripheral vascular disease, unspecified: Principal | ICD-10-CM

## 2017-01-20 DIAGNOSIS — I779 Disorder of arteries and arterioles, unspecified: Secondary | ICD-10-CM

## 2017-07-20 ENCOUNTER — Other Ambulatory Visit: Payer: Self-pay | Admitting: Family Medicine

## 2017-07-20 DIAGNOSIS — E119 Type 2 diabetes mellitus without complications: Secondary | ICD-10-CM

## 2017-07-31 ENCOUNTER — Ambulatory Visit
Admission: RE | Admit: 2017-07-31 | Discharge: 2017-07-31 | Disposition: A | Discharge status: Home / Self Care | Payer: Medicare Other | Source: Ambulatory Visit | Attending: Family Medicine | Admitting: Family Medicine

## 2017-07-31 DIAGNOSIS — E119 Type 2 diabetes mellitus without complications: Secondary | ICD-10-CM

## 2017-07-31 MED ORDER — IOPAMIDOL (ISOVUE-300) INJECTION 61%
100.0000 mL | Freq: Once | INTRAVENOUS | Status: AC | PRN
  Administered 2017-07-31: 100 mL via INTRAVENOUS

## 2017-08-31 ENCOUNTER — Encounter: Payer: Medicare Other | Attending: Family Medicine | Admitting: Dietician

## 2017-08-31 ENCOUNTER — Encounter: Payer: Self-pay | Admitting: Dietician

## 2017-08-31 DIAGNOSIS — E119 Type 2 diabetes mellitus without complications: Secondary | ICD-10-CM | POA: Diagnosis not present

## 2017-08-31 DIAGNOSIS — Z713 Dietary counseling and surveillance: Secondary | ICD-10-CM | POA: Insufficient documentation

## 2017-08-31 NOTE — Progress Notes (Signed)
Patient was seen on 08/31/17 for the first of a series of three diabetes self-management courses at the [facility].  Patient Education Plan per assessed needs and concerns is to attend three course education program for Diabetes Self Management Education.  The following learning objectives were met by the patient during this class:  Describe diabetes  State some common risk factors for diabetes  Defines the role of glucose and insulin  Identifies type of diabetes and pathophysiology  Describe the relationship between diabetes and cardiovascular risk  State the members of the Healthcare Team  States the rationale for glucose monitoring  State when to test glucose  State their individual Target Range  State the importance of logging glucose readings  Describe how to interpret glucose readings  Identifies A1C target  Explain the correlation between A1c and eAG values  State symptoms and treatment of high blood glucose  State symptoms and treatment of low blood glucose  Explain proper technique for glucose testing  Identifies proper sharps disposal  Handouts given during class include:  Living Well with Diabetes book  Carb Counting and Meal Planning book  Meal Plan Card  Meal planning worksheet  Low Sodium Flavoring Tips  Types of Fats  The diabetes portion plate  B6L to eAG Conversion Chart  Diabetes Recommended Care Schedule  Support Group  Diabetes Success Plan  Core Class Satisfaction Survey   Follow-Up Plan:  Attend core 2

## 2017-09-07 ENCOUNTER — Ambulatory Visit: Payer: Medicare Other

## 2017-09-12 ENCOUNTER — Encounter: Payer: Medicare Other | Admitting: Dietician

## 2017-09-12 DIAGNOSIS — Z713 Dietary counseling and surveillance: Secondary | ICD-10-CM | POA: Diagnosis not present

## 2017-09-12 DIAGNOSIS — E119 Type 2 diabetes mellitus without complications: Secondary | ICD-10-CM

## 2017-09-14 ENCOUNTER — Encounter: Payer: Medicare Other | Admitting: Dietician

## 2017-09-14 DIAGNOSIS — E119 Type 2 diabetes mellitus without complications: Secondary | ICD-10-CM

## 2017-09-14 DIAGNOSIS — Z713 Dietary counseling and surveillance: Secondary | ICD-10-CM | POA: Diagnosis not present

## 2017-09-14 NOTE — Progress Notes (Signed)
Patient was seen on 09/14/17 for the third of a series of three diabetes self-management courses at the [facility].   Jesse Dillon the amount of activity recommended for healthy living . Describe activities suitable for individual needs . Identify ways to regularly incorporate activity into daily life . Identify barriers to activity and ways to over come these barriers  Identify diabetes medications being personally used and their primary action for lowering glucose and possible side effects . Describe role of stress on blood glucose and develop strategies to address psychosocial issues . Identify diabetes complications and ways to prevent them  Explain how to manage diabetes during illness . Evaluate success in meeting personal goal . Establish 2-3 goals that they will plan to diligently work on  Goals:   I will count my carb choices at most meals and snacks  I will be active 150 minutes or more per week  To help manage stress I will  exercise at least 4 times a week  Your patient has identified these potential barriers to change:  Motivation  Your patient has identified their diabetes self-care support plan as  Family Support   Plan:  Attend Support Group as desired

## 2017-09-14 NOTE — Progress Notes (Signed)
Patient was seen on 09/12/17 for the second of a series of three diabetes self-management courses at the [facility]. The following learning objectives were met by the patient during this class:   Describe the role of different macronutrients on glucose  Explain how carbohydrates affect blood glucose  State what foods contain the most carbohydrates  Demonstrate carbohydrate counting  Demonstrate how to read Nutrition Facts food label  Describe effects of various fats on heart health  Describe the importance of good nutrition for health and healthy eating strategies  Describe techniques for managing your shopping, cooking and meal planning  List strategies to follow meal plan when dining out  Describe the effects of alcohol on glucose and how to use it safely  Goals:  Follow Diabetes Meal Plan as instructed  Aim to spread carbs evenly throughout the day  Aim for 3 meals per day and snacks as needed Include lean protein foods to meals/snacks  Monitor glucose levels as instructed by your doctor   Follow-Up Plan:  Attend Core 3  Work towards following your personal food plan.

## 2018-02-05 ENCOUNTER — Other Ambulatory Visit: Payer: Self-pay | Admitting: Family Medicine

## 2018-02-07 ENCOUNTER — Other Ambulatory Visit: Payer: Self-pay | Admitting: Family Medicine

## 2018-02-23 ENCOUNTER — Other Ambulatory Visit: Payer: Self-pay | Admitting: Family Medicine

## 2018-02-23 DIAGNOSIS — R918 Other nonspecific abnormal finding of lung field: Secondary | ICD-10-CM

## 2018-03-05 ENCOUNTER — Inpatient Hospital Stay: Admission: RE | Admit: 2018-03-05 | Payer: Medicare Other | Source: Ambulatory Visit

## 2018-03-05 ENCOUNTER — Inpatient Hospital Stay
Admission: RE | Admit: 2018-03-05 | Discharge: 2018-03-05 | Disposition: A | Discharge status: Home / Self Care | Payer: Medicare Other | Source: Ambulatory Visit | Attending: Family Medicine | Admitting: Family Medicine

## 2018-03-13 ENCOUNTER — Other Ambulatory Visit: Payer: Self-pay | Admitting: Family Medicine

## 2018-03-13 DIAGNOSIS — R911 Solitary pulmonary nodule: Secondary | ICD-10-CM

## 2018-03-13 DIAGNOSIS — R918 Other nonspecific abnormal finding of lung field: Secondary | ICD-10-CM

## 2018-07-19 ENCOUNTER — Other Ambulatory Visit: Payer: Self-pay | Admitting: Family Medicine

## 2018-07-19 DIAGNOSIS — R911 Solitary pulmonary nodule: Secondary | ICD-10-CM

## 2018-07-19 DIAGNOSIS — R918 Other nonspecific abnormal finding of lung field: Secondary | ICD-10-CM

## 2018-07-19 DIAGNOSIS — I722 Aneurysm of renal artery: Secondary | ICD-10-CM

## 2018-08-15 ENCOUNTER — Ambulatory Visit
Admission: RE | Admit: 2018-08-15 | Discharge: 2018-08-15 | Disposition: A | Discharge status: Home / Self Care | Payer: Medicare Other | Source: Ambulatory Visit | Attending: Family Medicine | Admitting: Family Medicine

## 2018-08-15 ENCOUNTER — Other Ambulatory Visit: Payer: Medicare Other

## 2018-08-15 DIAGNOSIS — R918 Other nonspecific abnormal finding of lung field: Secondary | ICD-10-CM

## 2018-08-15 DIAGNOSIS — I722 Aneurysm of renal artery: Secondary | ICD-10-CM

## 2018-08-15 MED ORDER — IOPAMIDOL (ISOVUE-370) INJECTION 76%
75.0000 mL | Freq: Once | INTRAVENOUS | Status: AC | PRN
  Administered 2018-08-15: 75 mL via INTRAVENOUS

## 2020-01-15 IMAGING — CT CT ABDOMEN WO/W CM
2 of 11 series · 12 of 46 positions shown, 18 images · IV contrast (iopamidol)
Comparison: None.

CLINICAL DATA: New diagnosis of diabetes. Evaluate for pancreatic
cancer. Recent diagnosis of gastritis. Increased amylase.

EXAM:
CT ABDOMEN WITHOUT AND WITH CONTRAST
TECHNIQUE: Multidetector CT imaging of the abdomen was performed following the
standard protocol before and following the bolus administration of
intravenous contrast.
CONTRAST:  100mL ZNXGII-NAA IOPAMIDOL (ZNXGII-NAA) INJECTION 61%

[Series 7: arterial phase 2.00 br36 s3 cor cor arterial · coronal · arterial · 0.61mm/px · 3 of 150 slices shown]
[im 38/150  soft-tissue]
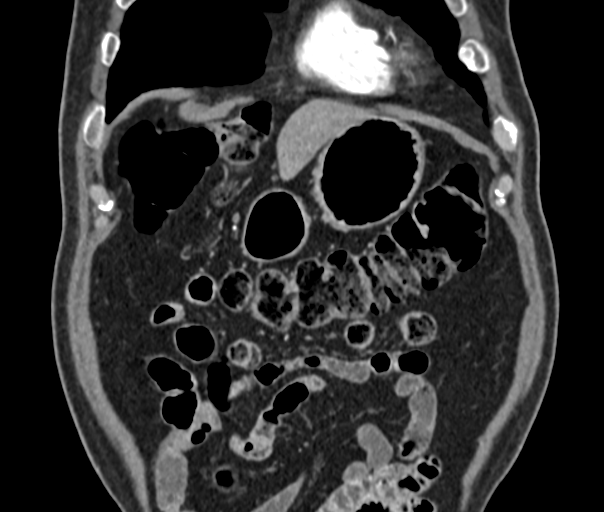
[im 75/150  soft-tissue]
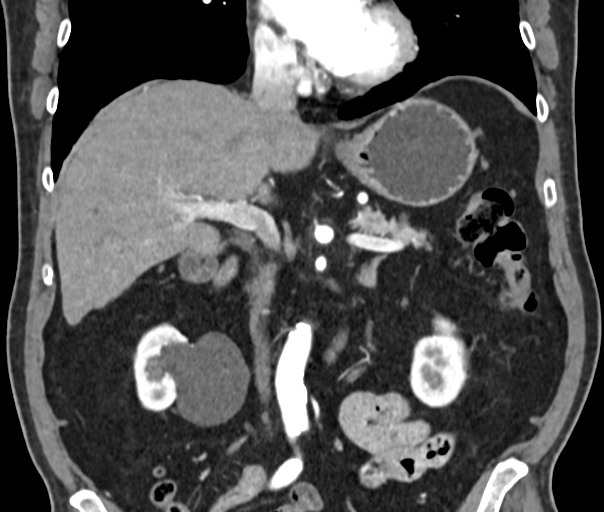
[im 112/150  soft-tissue]
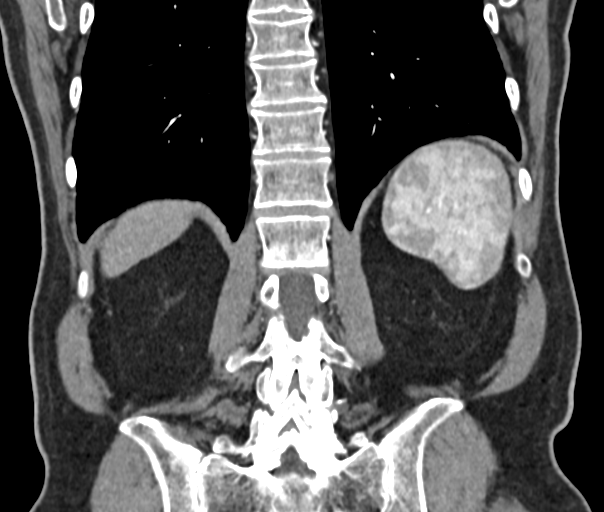

[Series 11: venous phase 2.00 br40 s3 pancreas venous · axial · portal-venous · 0.72mm/px · z∈[+1176,+1426]mm · 9 of 157 slices shown, 15 images]
[im 16/157  soft-tissue]
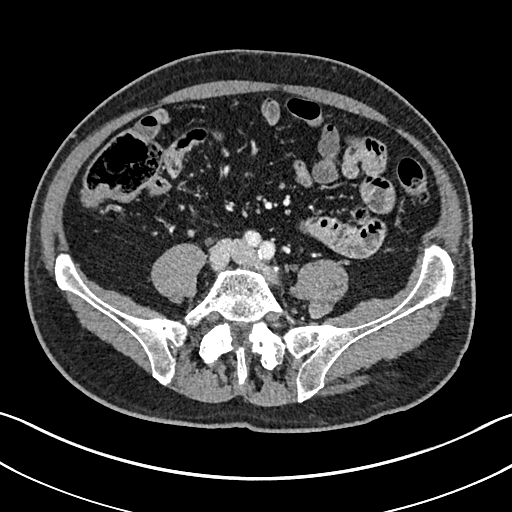
[im 16/157  bone]
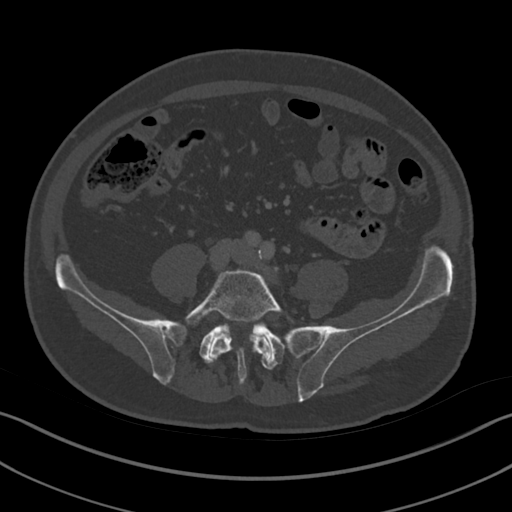
[im 32/157  soft-tissue]
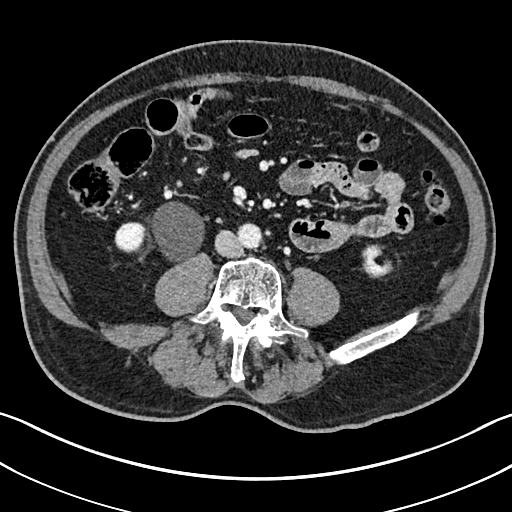
[im 47/157  soft-tissue]
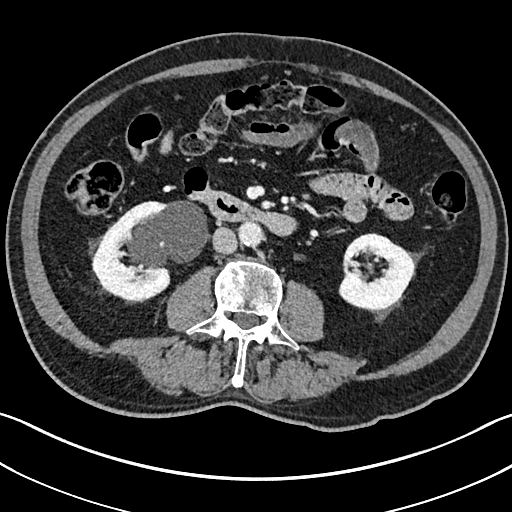
[im 63/157  soft-tissue]
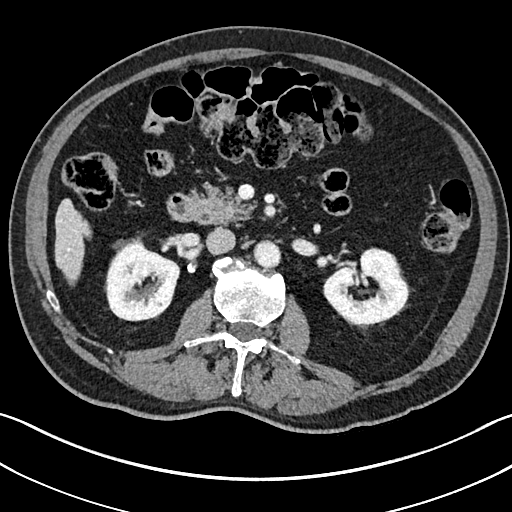
[im 79/157  soft-tissue]
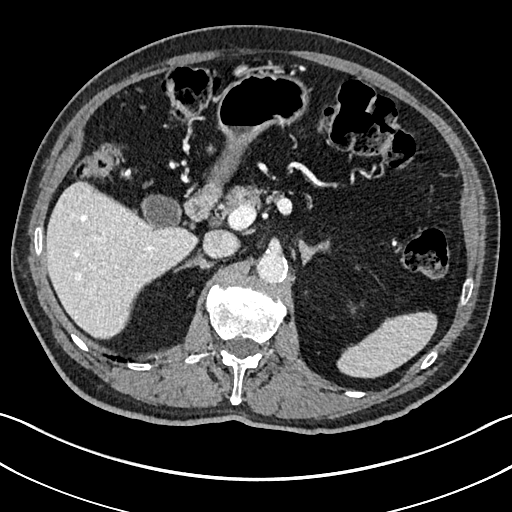
[im 94/157  soft-tissue]
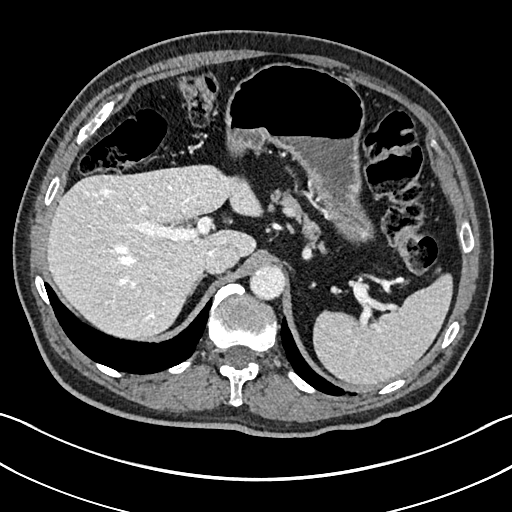
[im 94/157  lung]
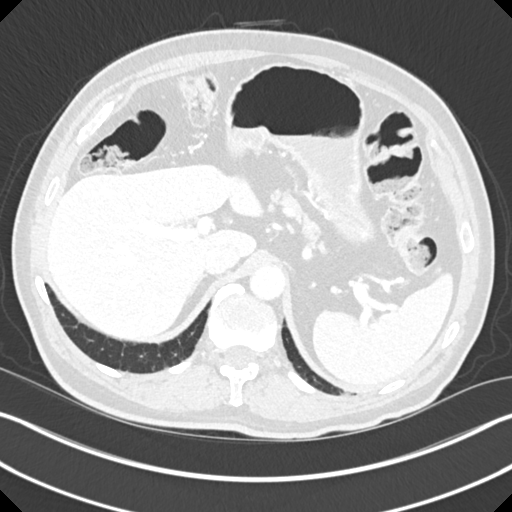
[im 110/157  soft-tissue]
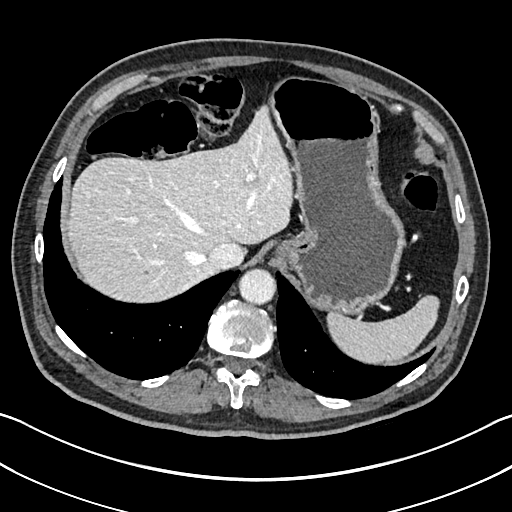
[im 110/157  lung]
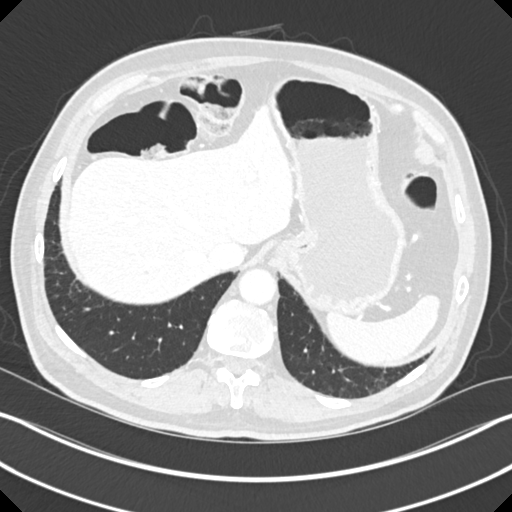
[im 125/157  soft-tissue]
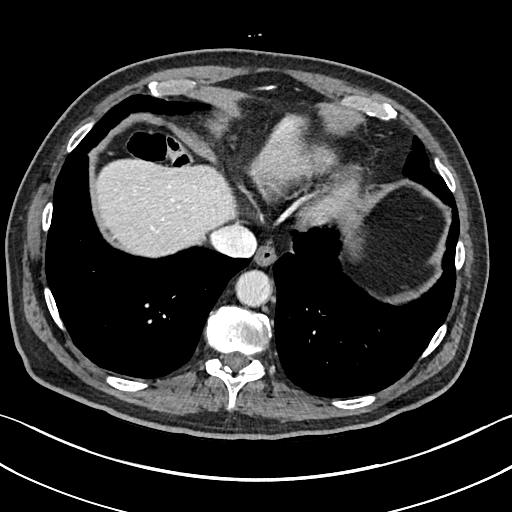
[im 125/157  lung]
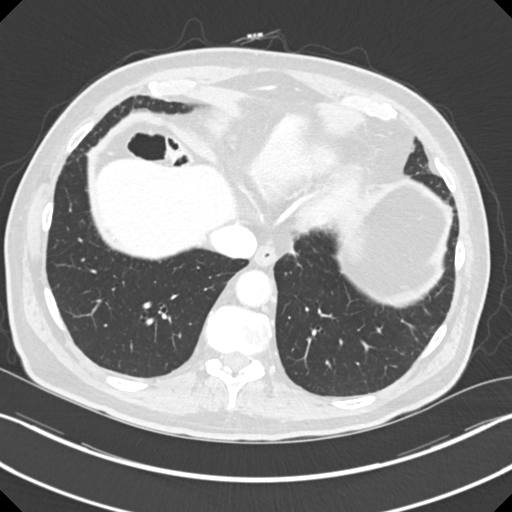
[im 141/157  soft-tissue]
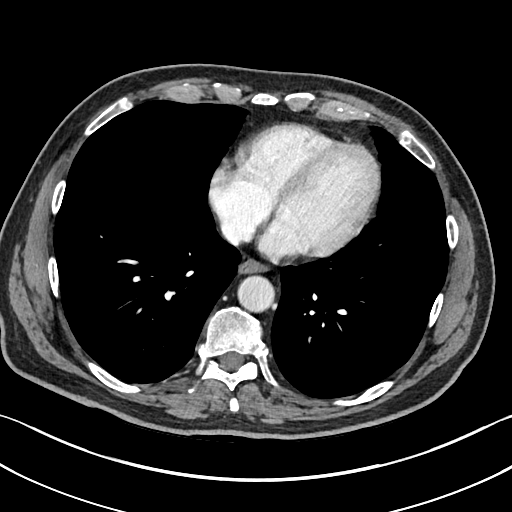
[im 141/157  lung]
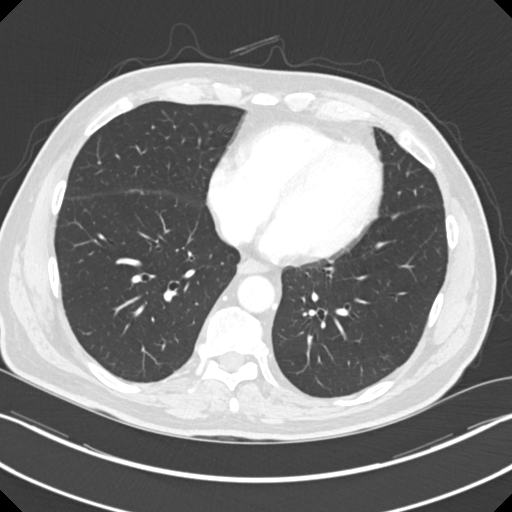
[im 141/157  bone]
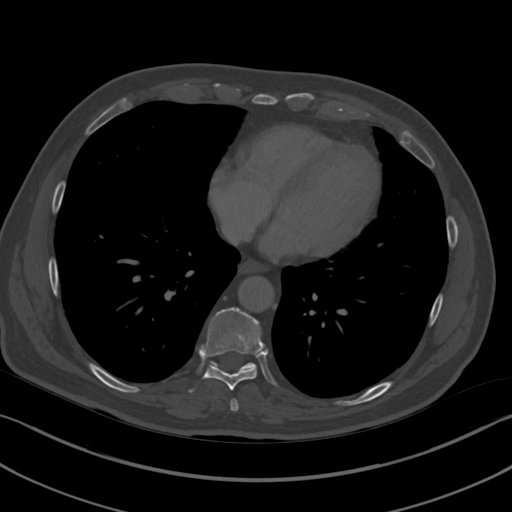

[12 of 46 positions shown; findings below may reference images not displayed]

FINDINGS: Lower chest: Right lower lobe pulmonary nodules x2 are on the order
of 2-3 mm. Example image [DATE].

Normal heart size without pericardial or pleural effusion.

Hepatobiliary: Foci of arterial phase hyperenhancement in the right
lobe of the liver measure less than 1 cm and are likely perfusion
anomalies. Too small to characterize low-density hepatic lesions are
likely cysts. No suspicious liver lesion. Normal gallbladder,
without biliary ductal dilatation.

Pancreas: No pancreatic mass, duct dilatation, or acute
inflammation.

Spleen: Normal in size, without focal abnormality.

Adrenals/Urinary Tract: Normal adrenal glands. 1.3 cm right renal
collecting system calculus. A right renal sinus cyst measures 6.1 x
4.1 cm and demonstrates a traversing vascular branch. Normal left
kidney.

Stomach/Bowel: Gastric antral underdistention. Extensive colonic
diverticulosis. Normal terminal ileum and appendix. Normal small
bowel.

Vascular/Lymphatic: Aortic and branch vessel atherosclerosis. Mild
ectasia of the celiac, including at 12 mm on image 73/4. Saccular
right renal artery aneurysm at 1.2 cm on image 97/4. No
retroperitoneal or retrocrural adenopathy.

Other: No ascites.

Musculoskeletal: Degenerative partial fusion of the bilateral
sacroiliac joints.
IMPRESSION: 1. Normal appearance of the pancreas. No evidence of pancreatic
mass.
2. Aortic Atherosclerosis ([id number]). Celiac ectasia with a 12 mm
right renal artery aneurysm. Consider follow-up CTA at 1 year.
3. Right nephrolithiasis.
4. Right lung base nodularity. No follow-up needed if patient is
low-risk. Non-contrast chest CT can be considered in 12 months if
patient is high-risk. This recommendation follows the consensus
statement: Guidelines for Management of Incidental Pulmonary Nodules
Detected on CT Images: From the [HOSPITAL] 6325; Radiology

## 2020-07-08 DIAGNOSIS — J40 Bronchitis, not specified as acute or chronic: Secondary | ICD-10-CM | POA: Diagnosis not present

## 2020-07-08 DIAGNOSIS — I722 Aneurysm of renal artery: Secondary | ICD-10-CM | POA: Diagnosis not present

## 2020-07-08 DIAGNOSIS — M8580 Other specified disorders of bone density and structure, unspecified site: Secondary | ICD-10-CM | POA: Diagnosis not present

## 2020-07-08 DIAGNOSIS — E782 Mixed hyperlipidemia: Secondary | ICD-10-CM | POA: Diagnosis not present

## 2020-07-08 DIAGNOSIS — E1169 Type 2 diabetes mellitus with other specified complication: Secondary | ICD-10-CM | POA: Diagnosis not present

## 2020-07-08 DIAGNOSIS — K219 Gastro-esophageal reflux disease without esophagitis: Secondary | ICD-10-CM | POA: Diagnosis not present

## 2020-07-15 DIAGNOSIS — E1169 Type 2 diabetes mellitus with other specified complication: Secondary | ICD-10-CM | POA: Diagnosis not present

## 2020-08-17 DIAGNOSIS — L812 Freckles: Secondary | ICD-10-CM | POA: Diagnosis not present

## 2020-08-17 DIAGNOSIS — L570 Actinic keratosis: Secondary | ICD-10-CM | POA: Diagnosis not present

## 2020-08-17 DIAGNOSIS — L821 Other seborrheic keratosis: Secondary | ICD-10-CM | POA: Diagnosis not present

## 2020-08-17 DIAGNOSIS — L308 Other specified dermatitis: Secondary | ICD-10-CM | POA: Diagnosis not present

## 2020-08-17 DIAGNOSIS — D1801 Hemangioma of skin and subcutaneous tissue: Secondary | ICD-10-CM | POA: Diagnosis not present

## 2023-08-10 DIAGNOSIS — E782 Mixed hyperlipidemia: Secondary | ICD-10-CM | POA: Diagnosis not present

## 2023-08-10 DIAGNOSIS — M8580 Other specified disorders of bone density and structure, unspecified site: Secondary | ICD-10-CM | POA: Diagnosis not present

## 2023-08-10 DIAGNOSIS — J309 Allergic rhinitis, unspecified: Secondary | ICD-10-CM | POA: Diagnosis not present

## 2023-08-10 DIAGNOSIS — N1831 Chronic kidney disease, stage 3a: Secondary | ICD-10-CM | POA: Diagnosis not present

## 2023-08-10 DIAGNOSIS — E1169 Type 2 diabetes mellitus with other specified complication: Secondary | ICD-10-CM | POA: Diagnosis not present

## 2024-02-06 DIAGNOSIS — M8580 Other specified disorders of bone density and structure, unspecified site: Secondary | ICD-10-CM | POA: Diagnosis not present

## 2024-02-06 DIAGNOSIS — D696 Thrombocytopenia, unspecified: Secondary | ICD-10-CM | POA: Diagnosis not present

## 2024-02-06 DIAGNOSIS — Z0000 Encounter for general adult medical examination without abnormal findings: Secondary | ICD-10-CM | POA: Diagnosis not present

## 2024-02-06 DIAGNOSIS — M8588 Other specified disorders of bone density and structure, other site: Secondary | ICD-10-CM | POA: Diagnosis not present

## 2024-02-06 DIAGNOSIS — E1169 Type 2 diabetes mellitus with other specified complication: Secondary | ICD-10-CM | POA: Diagnosis not present

## 2024-02-06 DIAGNOSIS — K219 Gastro-esophageal reflux disease without esophagitis: Secondary | ICD-10-CM | POA: Diagnosis not present

## 2024-02-06 DIAGNOSIS — E782 Mixed hyperlipidemia: Secondary | ICD-10-CM | POA: Diagnosis not present

## 2024-02-06 DIAGNOSIS — J309 Allergic rhinitis, unspecified: Secondary | ICD-10-CM | POA: Diagnosis not present

## 2024-02-06 DIAGNOSIS — N1831 Chronic kidney disease, stage 3a: Secondary | ICD-10-CM | POA: Diagnosis not present

## 2024-03-19 DIAGNOSIS — L812 Freckles: Secondary | ICD-10-CM | POA: Diagnosis not present

## 2024-03-19 DIAGNOSIS — L308 Other specified dermatitis: Secondary | ICD-10-CM | POA: Diagnosis not present

## 2024-03-19 DIAGNOSIS — L821 Other seborrheic keratosis: Secondary | ICD-10-CM | POA: Diagnosis not present

## 2024-03-19 DIAGNOSIS — D1801 Hemangioma of skin and subcutaneous tissue: Secondary | ICD-10-CM | POA: Diagnosis not present

## 2024-03-19 DIAGNOSIS — L570 Actinic keratosis: Secondary | ICD-10-CM | POA: Diagnosis not present

## 2024-03-28 DIAGNOSIS — G4733 Obstructive sleep apnea (adult) (pediatric): Secondary | ICD-10-CM | POA: Diagnosis not present
